# Patient Record
Sex: Male | Born: 2000 | Hispanic: No | Marital: Single | Smoking: Never smoker
Health system: Southern US, Community
[De-identification: ages and names within clinical notes are randomized; demographics above are authoritative.]

---
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
## 2022-02-03 ENCOUNTER — Other Ambulatory Visit: Payer: Self-pay

## 2022-02-03 ENCOUNTER — Encounter (HOSPITAL_COMMUNITY): Payer: Self-pay | Admitting: *Deleted

## 2022-02-03 ENCOUNTER — Emergency Department (HOSPITAL_COMMUNITY)
Admission: EM | Admit: 2022-02-03 | Discharge: 2022-02-03 | Disposition: A | Discharge status: Home / Self Care | Payer: Medicaid Other | Attending: Emergency Medicine | Admitting: Emergency Medicine

## 2022-02-03 DIAGNOSIS — J029 Acute pharyngitis, unspecified: Secondary | ICD-10-CM | POA: Diagnosis not present

## 2022-02-03 DIAGNOSIS — Z20822 Contact with and (suspected) exposure to covid-19: Secondary | ICD-10-CM | POA: Insufficient documentation

## 2022-02-03 LAB — RESP PANEL BY RT-PCR (FLU A&B, COVID) ARPGX2
Influenza A by PCR: NEGATIVE
Influenza B by PCR: NEGATIVE
SARS Coronavirus 2 by RT PCR: NEGATIVE

## 2022-02-03 LAB — GROUP A STREP BY PCR: Group A Strep by PCR: NOT DETECTED

## 2022-02-03 MED ORDER — IBUPROFEN 400 MG PO TABS
600.0000 mg | ORAL_TABLET | Freq: Once | ORAL | Status: AC
  Administered 2022-02-03: 600 mg via ORAL
  Filled 2022-02-03: qty 2

## 2022-02-03 NOTE — ED Provider Notes (Signed)
?[facility] EMERGENCY DEPARTMENT ?Provider Note ? ? ?CSN: [id number] ?Arrival date & time: 02/03/22  1857 ? ?  ? ?History ? ?Chief Complaint  ?Patient presents with  ? Sore Throat  ? ? ?Ethan Watts is a 21 y.o. male. ? ? ?Sore Throat ?Patient presents for symptoms of sore throat over the past 4 days.  While at home, he was inspecting his oropharynx with his phone.  He felt that his uvula was red and swollen.  He also was concerned of a small white spot on it.  For this reason, he presents to the ED.  He denies any associated symptoms.  He has not had a cough, fevers, or chills.  He has not had any abdominal upset.  He has not had any voice changes or difficulty swallowing. ? ?  ? ?Home Medications ?Prior to Admission medications   ?Medication Sig Start Date End Date Taking? Authorizing Provider  ?acetaminophen (TYLENOL) 325 MG tablet Take 650 mg by mouth every 6 (six) hours as needed.    [provider]  ?ibuprofen (ADVIL) 600 MG tablet Take 1 tablet (600 mg total) by mouth every 6 (six) hours as needed. 03/03/21   Burgess Amor, PA-C  ?valACYclovir (VALTREX) 1000 MG tablet Take 2 tablets (2000 mg) by mouth at first sign of cold sore. Repeat the dose once in 12 hours. 08/06/21   Arthor Captain, PA-C  ?   ? ?Allergies    ?Patient has no known allergies.   ? ?Review of Systems   ?Review of Systems  ?HENT:  Positive for sore throat.   ?All other systems reviewed and are negative. ? ?Physical Exam ?Updated Vital Signs ?BP (!) 145/86   Pulse 75   Temp 98.4 ?F (36.9 ?C) (Oral)   Resp 18   Ht 5\' 9"  (1.753 m)   Wt 99.8 kg   SpO2 100%   BMI 32.49 kg/m?  ?Physical Exam ?Vitals and nursing note reviewed.  ?Constitutional:   ?   General: He is not in acute distress. ?   Appearance: He is well-developed. He is not ill-appearing, toxic-appearing or diaphoretic.  ?HENT:  ?   Head: Normocephalic and atraumatic.  ?   Nose: No congestion.  ?   Mouth/Throat:  ?   Mouth: Mucous membranes are moist. No oral  lesions.  ?   Pharynx: Oropharynx is clear. Uvula midline. No pharyngeal swelling, oropharyngeal exudate or uvula swelling.  ?   Tonsils: No tonsillar exudate or tonsillar abscesses.  ?Eyes:  ?   Conjunctiva/sclera: Conjunctivae normal.  ?Cardiovascular:  ?   Rate and Rhythm: Normal rate and regular rhythm.  ?   Heart sounds: No murmur heard. ?Pulmonary:  ?   Effort: Pulmonary effort is normal. No respiratory distress.  ?   Breath sounds: Normal breath sounds. No wheezing or rales.  ?Abdominal:  ?   Palpations: Abdomen is soft.  ?   Tenderness: There is no abdominal tenderness.  ?Musculoskeletal:     ?   General: No swelling.  ?   Cervical back: Neck supple.  ?Skin: ?   General: Skin is warm and dry.  ?   Capillary Refill: Capillary refill takes less than 2 seconds.  ?Neurological:  ?   General: No focal deficit present.  ?   Mental Status: He is alert and oriented to person, place, and time.  ?Psychiatric:     ?   Mood and Affect: Mood normal.     ?   Behavior: Behavior normal.  ? ? ?  ED Results / Procedures / Treatments   ?Labs ?(all labs ordered are listed, but only abnormal results are displayed) ?Labs Reviewed  ?RESP PANEL BY RT-PCR (FLU A&B, COVID) ARPGX2  ?GROUP A STREP BY PCR  ? ? ?EKG ?None ? ?Radiology ?No results found. ? ?Procedures ?Procedures  ? ? ?Medications Ordered in ED ?Medications  ?ibuprofen (ADVIL) tablet 600 mg (600 mg Oral Given 02/03/22 2136)  ? ? ?ED Course/ Medical Decision Making/ A&P ?  ?                        ?Medical Decision Making ?Risk ?Prescription drug management. ? ? ?Patient is a healthy 21 year old male who comes to the ED due to concerns of sore throat and what he describes as a red and swollen uvula.  He identified this at home using his camera phone.  On arrival in the ED, he is afebrile and well-appearing on exam.  On inspection of his oropharynx, I do not feel that his uvula is swollen or erythematous.  Oropharynx is widely patent and there is no areas of swelling.  No  exudates are present.  I did review the patient's video footage of he took of his own uvula.  When he took these images, his uvula was red and mildly swollen.  Since he took these images, swelling and erythema has resolved.  He was tested for COVID, flu, and strep.  Results of these test were negative.  He was given ibuprofen for symptomatic relief.  He was advised to continue ibuprofen as needed for pain, to stay well-hydrated, and to return to the ED if he does have any persistent or worsening symptoms.  He is stable for discharge at this time. ? ? ? ? ? ? ? ?Final Clinical Impression(s) / ED Diagnoses ?Final diagnoses:  ?Sore throat  ? ? ?Rx / DC Orders ?ED Discharge Orders   ? ? None  ? ?  ? ? ?  ?Gloris Manchester, MD ?02/05/22 [phone] ? ?

## 2022-02-03 NOTE — ED Triage Notes (Signed)
Pt with sore throat x 4 days. Denies any chills or fever. Throat is red and uvula is swollen.  ?

## 2022-02-03 NOTE — Discharge Instructions (Signed)
Take ibuprofen and Tylenol for pain.  Stay hydrated.  Return to emergency department if you experience worsening severity of symptoms. ?

## 2022-05-13 IMAGING — DX DG LUMBAR SPINE COMPLETE 4+V
5 series · 5 of 5 positions shown · non-contrast
Comparison: None.

CLINICAL DATA: Fall, low back pain

EXAM:
LUMBAR SPINE - COMPLETE 4+ VIEW

[l-spine ap]
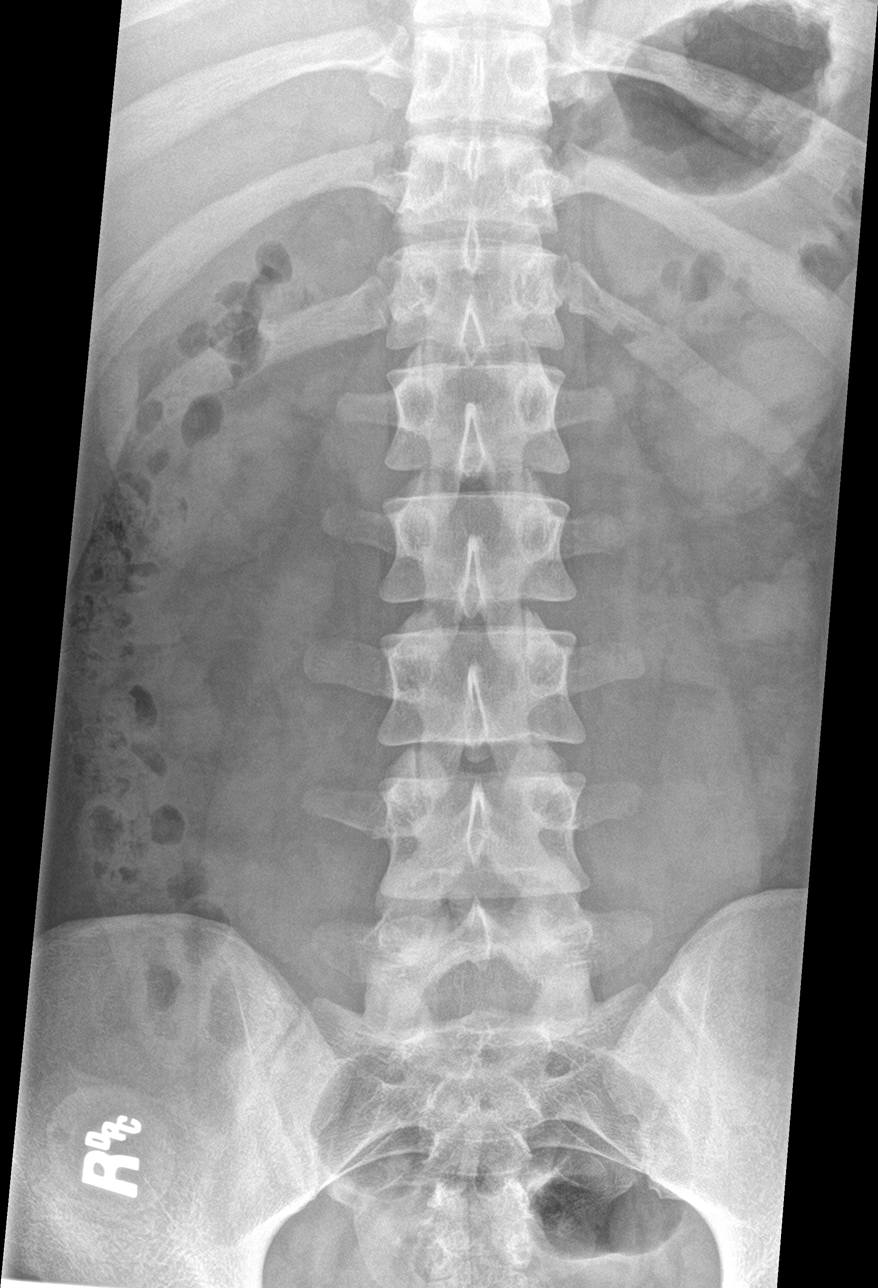

[l-spine obl (1 of 2)]
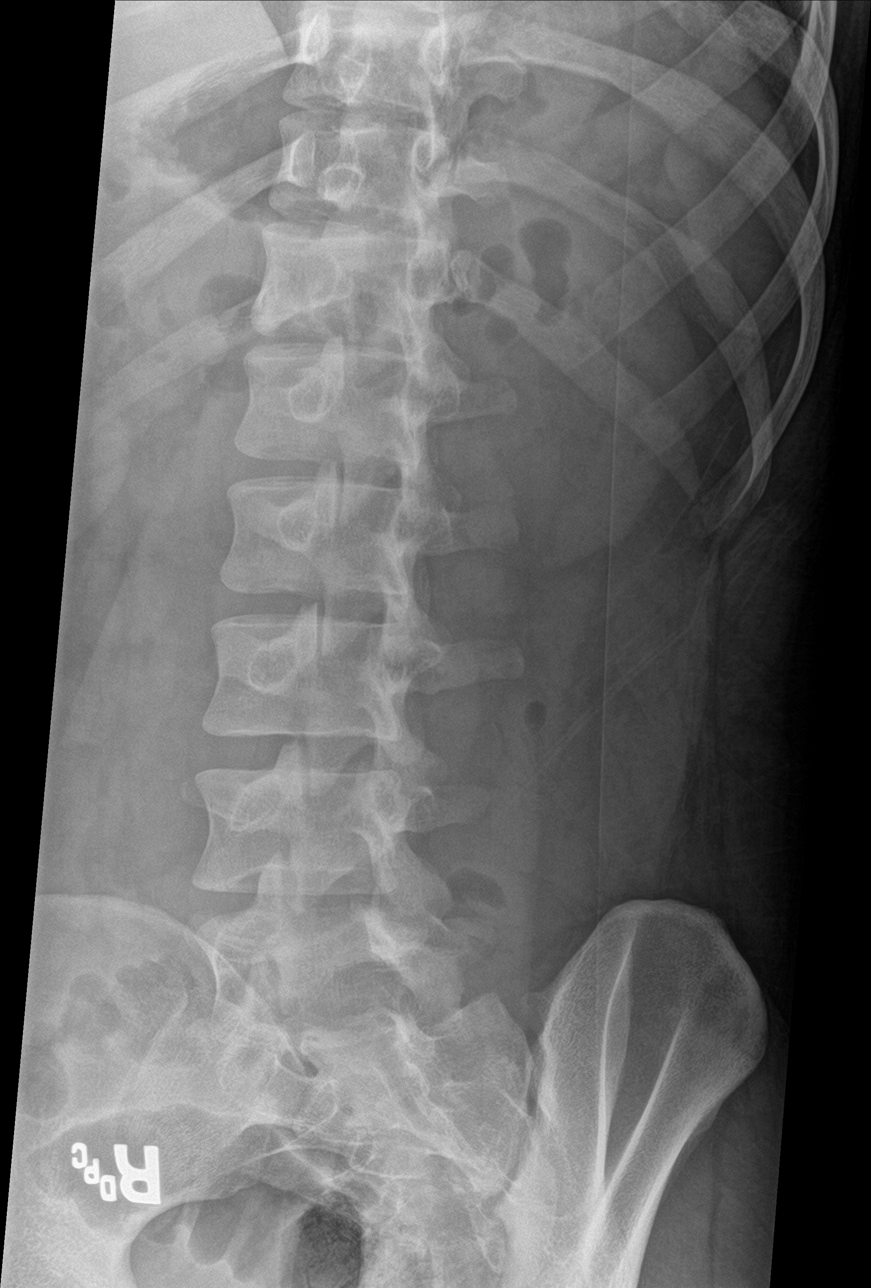

[l-spine obl (2 of 2)]
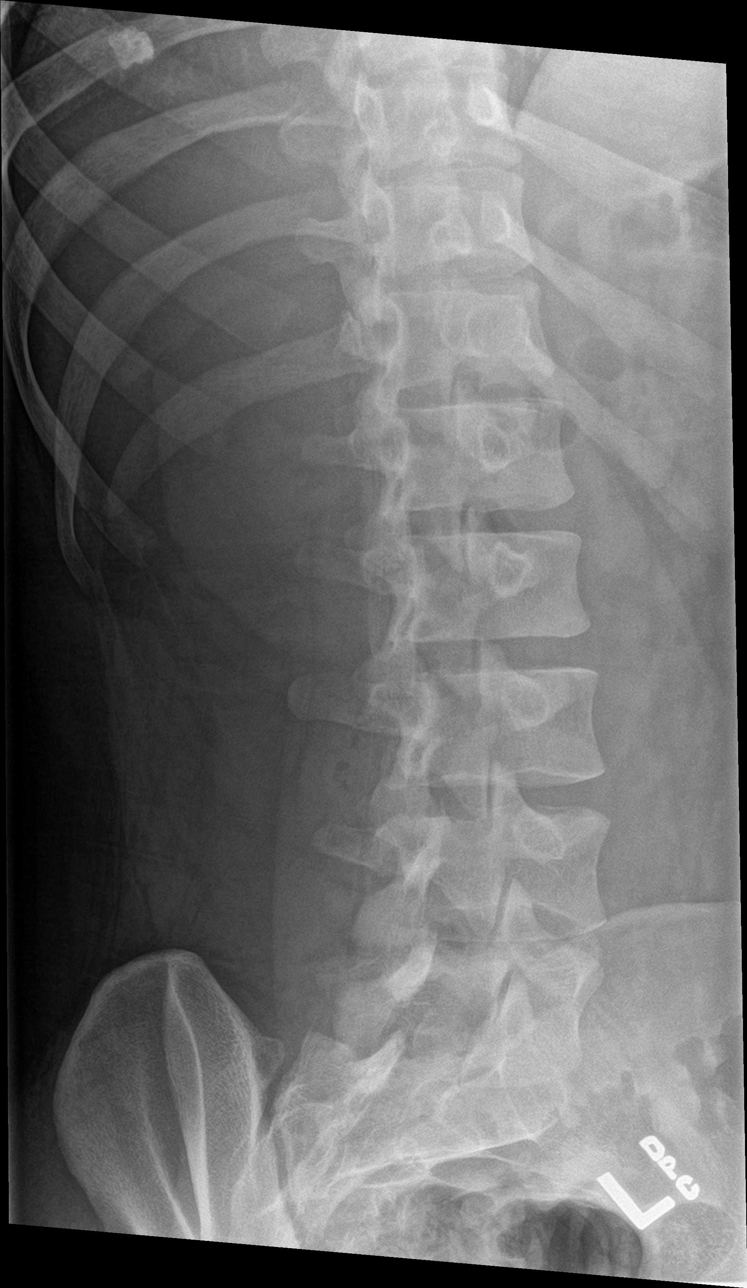

[l-spine lat]
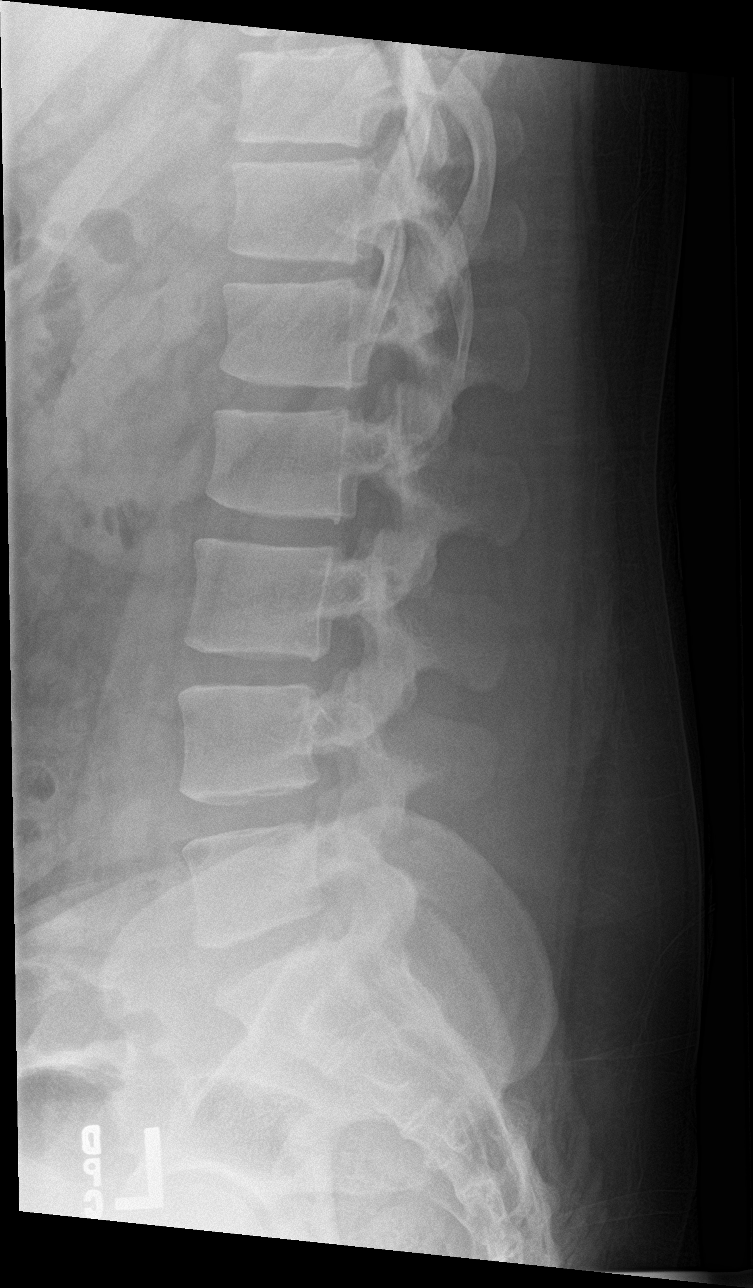

[l-spine spot]
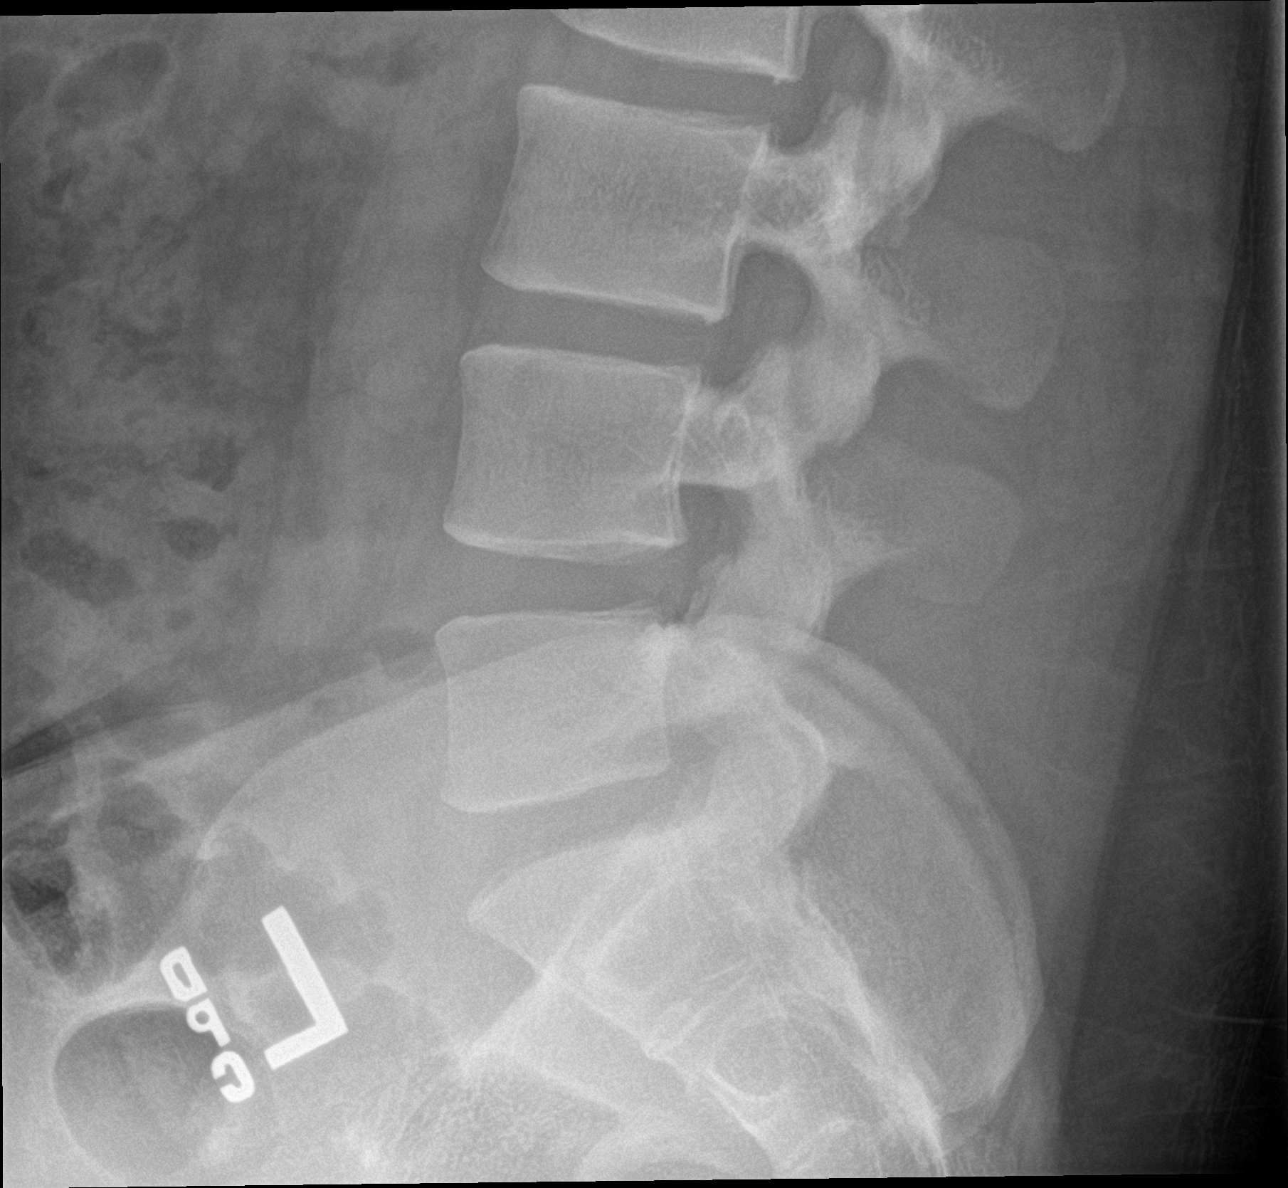

[5 of 5 positions shown; findings below may reference images not displayed]

FINDINGS: There is no evidence of lumbar spine fracture. Alignment is normal.
Intervertebral disc spaces are maintained.
IMPRESSION: Negative.
